# Patient Record
Sex: Male | Born: 1990 | Race: White | Hispanic: No | Marital: Single | Smoking: Current every day smoker
Health system: Southern US, Community
[De-identification: ages and names within clinical notes are randomized; demographics above are authoritative.]

---
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
## 2012-08-20 ENCOUNTER — Encounter (HOSPITAL_BASED_OUTPATIENT_CLINIC_OR_DEPARTMENT_OTHER): Payer: Self-pay | Admitting: Emergency Medicine

## 2012-08-20 ENCOUNTER — Emergency Department (HOSPITAL_BASED_OUTPATIENT_CLINIC_OR_DEPARTMENT_OTHER)
Admission: EM | Admit: 2012-08-20 | Discharge: 2012-08-21 | Disposition: A | Discharge status: Home / Self Care | Payer: Medicaid Other | Attending: Emergency Medicine | Admitting: Emergency Medicine

## 2012-08-20 DIAGNOSIS — W268XXA Contact with other sharp object(s), not elsewhere classified, initial encounter: Secondary | ICD-10-CM | POA: Insufficient documentation

## 2012-08-20 DIAGNOSIS — Y9289 Other specified places as the place of occurrence of the external cause: Secondary | ICD-10-CM | POA: Insufficient documentation

## 2012-08-20 DIAGNOSIS — S61411A Laceration without foreign body of right hand, initial encounter: Secondary | ICD-10-CM

## 2012-08-20 DIAGNOSIS — S61409A Unspecified open wound of unspecified hand, initial encounter: Secondary | ICD-10-CM | POA: Insufficient documentation

## 2012-08-20 DIAGNOSIS — F17200 Nicotine dependence, unspecified, uncomplicated: Secondary | ICD-10-CM | POA: Insufficient documentation

## 2012-08-20 DIAGNOSIS — Y9389 Activity, other specified: Secondary | ICD-10-CM | POA: Insufficient documentation

## 2012-08-20 DIAGNOSIS — Z880 Allergy status to penicillin: Secondary | ICD-10-CM | POA: Insufficient documentation

## 2012-08-20 NOTE — ED Notes (Signed)
MD at bedside for laceration repair.

## 2012-08-20 NOTE — ED Notes (Signed)
MD at bedside. 

## 2012-08-20 NOTE — ED Notes (Signed)
Pt sliced his right hand on sheet metal earlier today, got the bleeding stopped after about an hour. Approx 3 inch lac to hand. Bleeding is controlled.

## 2012-08-21 NOTE — ED Provider Notes (Signed)
History     CSN: [id number]  Arrival date & time 08/20/12  2110   First MD Initiated Contact with Patient 08/20/12 2211      Chief Complaint  Patient presents with  . Extremity Laceration    (Consider location/radiation/quality/duration/timing/severity/associated sxs/prior treatment) The history is provided by the patient.   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 Patient states tetanus is up to date. He reports the wound at the scene. At home was concerned that maybe it needed sutures according to a neighbor so he came in for evaluation. There was bleeding initially but it is well controlled now patient has good range of motion of his fingers to the hand no numbness. Pain is minimal maybe it 2-4/10 worse with palpation of the wound. Pain is nonradiating pains described as sharp.  No past medical history on file.  No past surgical history on file.  No family history on file.  History  Substance Use Topics  . Smoking status: Current Every Day Smoker -- 1.00 packs/day  . Smokeless tobacco: Not on file  . Alcohol Use: Yes     Comment: social      Review of Systems  Constitutional: Negative for fever.  HENT: Negative for congestion.   Respiratory: Negative for shortness of breath.   Cardiovascular: Negative for chest pain.  Gastrointestinal: Negative for abdominal pain.  Musculoskeletal: Negative for joint swelling.  Skin: Positive for wound.  Allergic/Immunologic: Negative for immunocompromised state.  Neurological: Negative for numbness.  Hematological: Does not bruise/bleed easily.  Psychiatric/Behavioral: Negative for confusion.    Allergies  Penicillins and Sulfa antibiotics  Home Medications  No current outpatient prescriptions on file.  BP 128/79  Pulse 65  Temp(Src) 98.5 F (36.9 C) (Oral)  Resp 16  Ht 5\' 8"  (1.727 m)   Wt 145 lb (65.772 kg)  BMI 22.05 kg/m2  SpO2 97%  Physical Exam  Nursing note and vitals reviewed. Constitutional: He is oriented to person, place, and time. He appears well-developed and well-nourished. No distress.  HENT:  Head: Normocephalic and atraumatic.  Mouth/Throat: Oropharynx is clear and moist.  Neck: Normal range of motion.  Cardiovascular: Normal rate, regular rhythm and normal heart sounds.   Pulmonary/Chest: Effort normal and breath sounds normal.  Abdominal: Soft. Bowel sounds are normal. There is no tenderness.  Musculoskeletal: Normal range of motion. He exhibits tenderness.  Normal except for right hand. Patient is right-hand dominant the ulnar side of the palm with a 5 cm linear laceration through the dermis but not involving deeper structures. Goes up to the MP crease on the palm. No evidence of foreign body no tendon evidence of tendon injury. Cap refill along the little finger and ring finger are normal good range of motion of both. Radial pulses 2+.    Neurological: He is alert and oriented to person, place, and time. No cranial nerve deficit. He exhibits normal muscle tone. Coordination normal.  Skin: Skin is warm.    ED Course  Procedures (including critical care time)  Labs Reviewed - No data to display No results found.  LACERATION REPAIR Performed by: Shelda Jakes. Authorized by: Shelda Jakes. Consent: Verbal consent obtained. Risks and benefits: risks, benefits and alternatives were discussed Consent given by: patient Patient identity confirmed: provided demographic data Prepped and Draped in normal sterile fashion Wound explored  Laceration Location: Right hand palm ulnar side.  Laceration Length: 5cm  No Foreign Bodies seen or palpated  Anesthesia: local infiltration  Local anesthetic: lidocaine 2% without epinephrine  Anesthetic total: 1 ml  Irrigation method: syringe Amount of cleaning: standard  Skin closure: Simple  interrupted. 4-0 Prolene   Number of sutures: 3  Technique: Simple interrupted.   Patient tolerance: Patient tolerated the procedure well with no immediate complications. No evidence of foreign body or deep structure injury.    1. Hand laceration, right, initial encounter       MDM  Laceration to the radial side of the palm of the right hand. Injury occurred the in the afternoon while working with sheet metal approximate time injury was 4:00 in the afternoon. 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 He cleaned the wound well with water when he got home Sunday but needed to be sutured so he came into for evaluation.  The wound was not significantly deep but it was through the dermis. No tendon involvement. Bleeding well controlled. No signs of infection. The wound was reapproximated loosely with 4-0 Prolene only 3 sutures placed. To allow some drainage and prevent infection since the wound is being closed several hours after the injury. Patient given instructions on wound care.        Shelda Jakes, MD 08/21/12 [phone]

## 2019-01-24 ENCOUNTER — Other Ambulatory Visit: Payer: Self-pay

## 2019-01-24 ENCOUNTER — Emergency Department (HOSPITAL_BASED_OUTPATIENT_CLINIC_OR_DEPARTMENT_OTHER)
Admission: EM | Admit: 2019-01-24 | Discharge: 2019-01-24 | Disposition: A | Discharge status: Home / Self Care | Payer: Self-pay | Attending: Emergency Medicine | Admitting: Emergency Medicine

## 2019-01-24 ENCOUNTER — Encounter (HOSPITAL_BASED_OUTPATIENT_CLINIC_OR_DEPARTMENT_OTHER): Payer: Self-pay

## 2019-01-24 DIAGNOSIS — J020 Streptococcal pharyngitis: Secondary | ICD-10-CM

## 2019-01-24 DIAGNOSIS — F17210 Nicotine dependence, cigarettes, uncomplicated: Secondary | ICD-10-CM | POA: Insufficient documentation

## 2019-01-24 DIAGNOSIS — Z880 Allergy status to penicillin: Secondary | ICD-10-CM | POA: Insufficient documentation

## 2019-01-24 DIAGNOSIS — Z882 Allergy status to sulfonamides status: Secondary | ICD-10-CM | POA: Insufficient documentation

## 2019-01-24 LAB — GROUP A STREP BY PCR: Group A Strep by PCR: DETECTED — AB

## 2019-01-24 MED ORDER — ACETAMINOPHEN 325 MG PO TABS
650.0000 mg | ORAL_TABLET | Freq: Once | ORAL | Status: AC | PRN
  Administered 2019-01-24: 17:00:00 650 mg via ORAL
  Filled 2019-01-24: qty 2

## 2019-01-24 MED ORDER — DEXAMETHASONE 6 MG PO TABS
10.0000 mg | ORAL_TABLET | Freq: Once | ORAL | Status: AC
  Administered 2019-01-24: 10 mg via ORAL
  Filled 2019-01-24: qty 1

## 2019-01-24 MED ORDER — CLINDAMYCIN HCL 300 MG PO CAPS: 300.0000 mg | ORAL_CAPSULE | Freq: Three times a day (TID) | ORAL | 0 refills | Status: AC

## 2019-01-24 NOTE — ED Provider Notes (Signed)
[facility] EM[facility]ENC[facility] DEPARTMENT Provider Note   CSN: [id number] Arrival date & time: 01/24/19  1643     History   Chief Complaint Chief Complaint  Patient presents with  . Sore Throat    HPI Raymond Santos is a 27 y.o. male.     The history is provided by the patient.  Sore Throat This is a new problem. The current episode started yesterday. The problem occurs constantly. The problem has not changed since onset.Pertinent negatives include no chest pain, no abdominal pain, no headaches and no shortness of breath. The symptoms are aggravated by swallowing. Nothing relieves the symptoms. He has tried acetaminophen for the symptoms. The treatment provided moderate relief.    History reviewed. No pertinent past medical history.  There are no active problems to display for this patient.   History reviewed. No pertinent surgical history.      Home Medications    Prior to Admission medications   Medication Sig Start Date End Date Taking? Authorizing Provider  clindamycin (CLEOCIN) 300 MG capsule Take 1 capsule (300 mg total) by mouth 3 (three) times daily for 10 days. 01/24/19 02/03/19  Lennice Sites, DO    Family History No family history on file.  Social History Social History   Tobacco Use  . Smoking status: Current Every Day Smoker    Packs/day: 1.00  . Smokeless tobacco: Never Used  Substance Use Topics  . Alcohol use: Yes    Comment: social  . Drug use: Yes    Types: Marijuana     Allergies   Penicillins and Sulfa antibiotics   Review of Systems Review of Systems  Constitutional: Negative for chills and fever.  HENT: Positive for sore throat. Negative for congestion, dental problem, ear pain, hearing loss, mouth sores, sinus pain, tinnitus and voice change.   Eyes: Negative for pain and visual disturbance.  Respiratory: Negative for cough and shortness of breath.   Cardiovascular: Negative for chest pain and palpitations.   Gastrointestinal: Negative for abdominal pain and vomiting.  Genitourinary: Negative for dysuria and hematuria.  Musculoskeletal: Negative for arthralgias and back pain.  Skin: Negative for color change and rash.  Neurological: Negative for seizures, syncope and headaches.  All other systems reviewed and are negative.    Physical Exam Updated Vital Signs BP 129/89 (BP Location: Left Arm)   Pulse 86   Temp (!) 100.9 F (38.3 C) (Oral)   Resp 20   Ht 5\' 6"  (1.676 m)   Wt 59 kg   SpO2 98%   BMI 20.98 kg/m   Physical Exam Vitals signs and nursing note reviewed.  Constitutional:      Appearance: He is well-developed.  HENT:     Head: Normocephalic and atraumatic.     Right Ear: Tympanic membrane normal.     Left Ear: Tympanic membrane normal.     Mouth/Throat:     Mouth: Mucous membranes are moist.     Pharynx: Uvula midline. Oropharyngeal exudate and posterior oropharyngeal erythema present. No pharyngeal swelling or uvula swelling.     Tonsils: Tonsillar exudate present. No tonsillar abscesses. 2+ on the right. 2+ on the left.  Eyes:     Conjunctiva/sclera: Conjunctivae normal.  Neck:     Musculoskeletal: Normal range of motion and neck supple.  Cardiovascular:     Rate and Rhythm: Normal rate and regular rhythm.     Heart sounds: Normal heart sounds. No murmur.  Pulmonary:     Effort: Pulmonary effort is normal.  No respiratory distress.     Breath sounds: Normal breath sounds.  Abdominal:     Palpations: Abdomen is soft.     Tenderness: There is no abdominal tenderness.  Skin:    General: Skin is warm and dry.     Capillary Refill: Capillary refill takes less than 2 seconds.  Neurological:     General: No focal deficit present.     Mental Status: He is alert.      ED Treatments / Results  Labs (all labs ordered are listed, but only abnormal results are displayed) Labs Reviewed  GROUP A STREP BY PCR - Abnormal; Notable for the following components:       Result Value   Group A Strep by PCR DETECTED (*)    All other components within normal limits    EKG None  Radiology No results found.  Procedures Procedures (including critical care time)  Medications Ordered in ED Medications  acetaminophen (TYLENOL) tablet 650 mg (650 mg Oral Given 01/24/19 1702)  dexamethasone (DECADRON) tablet 10 mg (10 mg Oral Given 01/24/19 1751)     Initial Impression / Assessment and Plan / ED Course  I have reviewed the triage vital signs and the nursing notes.  Pertinent labs & imaging results that were available during my care of the patient were reviewed by me and considered in my medical decision making (see chart for details).        Raymond Santos is a 28 year old male who presents to the ED with sore throat.  Patient with fever but otherwise normal vitals.  Has exudates of his tonsils bilaterally with some redness and swelling but no obvious abscess.  No concern for peritonsillar abscess or retropharyngeal abscess.  Normal voice.  No signs of trismus.  No signs of abscess in the throat.  Normal range of motion of the neck.  No difficulty with mouth opening or closing.  Positive for strep pharyngitis.  Allergic to penicillins will treat with clindamycin.  Patient given dose of Decadron.  Recommend Tylenol Motrin as well for fever.  Given return precautions as this could develop into an abscess but at this time does not appear to have any concerning signs to suggest abscess or deeper space infection.  Given return precautions and discharged from ED in good condition.  This chart was dictated using voice recognition software.  Despite best efforts to proofread,  errors can occur which can change the documentation meaning.    Final Clinical Impressions(s) / ED Diagnoses   Final diagnoses:  Strep pharyngitis    ED Discharge Orders         Ordered    clindamycin (CLEOCIN) 300 MG capsule  3 times daily     01/24/19 1756           Virgina Norfolk, DO 01/24/19 1759

## 2019-01-24 NOTE — ED Triage Notes (Signed)
Pt c/o sore throat, fevers, chills x yesterday.

## 2019-01-28 ENCOUNTER — Ambulatory Visit: Payer: Self-pay | Admitting: *Deleted

## 2019-01-28 NOTE — Telephone Encounter (Signed)
   Reason for Disposition . Difficulty breathing (per caller) but not severe  Answer Assessment - Initial Assessment Questions 1. ANTIBIOTIC: "What antibiotic are you receiving?" "How many times per day?"     Taking Clindamycin  2. ONSET: "When was the antibiotic started?"     Started antibiotic on 01/24/2019 3. SEVERITY: "How bad is the sore throat?"    - MILD: doesn't interfere with eating    - MODERATE: interferes with eating some solids    - SEVERE: can't swallow liquids; drooling      Mild  4. FEVER: "Do you have a fever?" If so, ask: "What is your temperature, how was it measured, and when did it start?"     No fever since starting antibiotic  5. SYMPTOMS: "Are there any other symptoms you're concerned about?" If so, ask: "When did it start?"     Swelling and white pus in back of throat, felt short of breath yesterday evening, better today  6. PREGNANCY: "Is there any chance you are pregnant?" "When was your last menstrual period?" N/A  Protocols used: STREP THROAT INFECTION ON ANTIBIOTIC FOLLOW-UP CALL-A-AH  Patient states he was seen at [facility] ED 01/24/2019 and was diagnosed with strep throat and started on Clindamycin.  He has been taking the medication as prescribed three times per day.  He states his fever and sore throat have improved, but swelling and white exudate is getting worse.  He states he only has mild sore throat that is improved with Aleve.  He states he did feel slightly short of breath last night.  He is not feeling short of breath today, but feels the swelling and white exudate in back of throat are worse today.  Advised patient he should proceed back to [facility] ED for evaluation today to make sure there is not abscess.  Patient agreeable.

## 2019-09-19 ENCOUNTER — Other Ambulatory Visit: Payer: Self-pay

## 2019-09-19 ENCOUNTER — Emergency Department (HOSPITAL_BASED_OUTPATIENT_CLINIC_OR_DEPARTMENT_OTHER)
Admission: EM | Admit: 2019-09-19 | Discharge: 2019-09-19 | Disposition: A | Discharge status: Home / Self Care | Payer: Self-pay | Attending: Emergency Medicine | Admitting: Emergency Medicine

## 2019-09-19 ENCOUNTER — Encounter (HOSPITAL_BASED_OUTPATIENT_CLINIC_OR_DEPARTMENT_OTHER): Payer: Self-pay | Admitting: Emergency Medicine

## 2019-09-19 DIAGNOSIS — Z882 Allergy status to sulfonamides status: Secondary | ICD-10-CM | POA: Insufficient documentation

## 2019-09-19 DIAGNOSIS — F17210 Nicotine dependence, cigarettes, uncomplicated: Secondary | ICD-10-CM | POA: Insufficient documentation

## 2019-09-19 DIAGNOSIS — Z880 Allergy status to penicillin: Secondary | ICD-10-CM | POA: Insufficient documentation

## 2019-09-19 DIAGNOSIS — L089 Local infection of the skin and subcutaneous tissue, unspecified: Secondary | ICD-10-CM | POA: Insufficient documentation

## 2019-09-19 MED ORDER — CLINDAMYCIN HCL 150 MG PO CAPS: 300.0000 mg | ORAL_CAPSULE | Freq: Three times a day (TID) | ORAL | 0 refills | Status: DC

## 2019-09-19 MED ORDER — CLINDAMYCIN HCL 150 MG PO CAPS: 300.0000 mg | ORAL_CAPSULE | Freq: Three times a day (TID) | ORAL | 0 refills | Status: AC

## 2019-09-19 NOTE — ED Triage Notes (Signed)
C/o staph infections left lower leg, inside of left wrist. States h/o same, started few weeks ago

## 2019-09-19 NOTE — ED Notes (Signed)
ED Provider at bedside. 

## 2019-09-19 NOTE — Discharge Instructions (Signed)
Take antibiotics as prescribed. Take the entire course, even if your symptoms improve.  Use Tylenol and ibuprofen to help with pain. Wash daily with soap and water.  Use warm compress 3 times a day to help with inflammation. Keep covered and clean as needed.  Return to the emergency room if you develop fevers, severe worsening pain, increased posturing for the area, or any new, worsening, or concerning symptoms.

## 2019-09-19 NOTE — ED Provider Notes (Signed)
[facility] EMERGENCY DEPARTMENT Provider Note   CSN: [id number] Arrival date & time: 09/19/19  5974     History Chief Complaint  Patient presents with  . Cellulitis    Raymond Santos is a 29 y.o. male presenting for evaluation of skin lesion.   Pt states over the past several weeks he has had multiple skin lesions. He has 2 lesions on his L wrist and one on his anterior left shin. The lesion on his shin started 3 days ago. It is the most painful. It is draining purulent material, none of the other lesions are. He is using warm rags x1/day, using neosporin, and keeping it clean with a bandaid. He has no other medical problems, takes no medications daily. He denies fevers, chill, n/v, abd pain. He has not been in any hot tubs lakes, or bodies of water recently.   HPI     History reviewed. No pertinent past medical history.  There are no problems to display for this patient.   History reviewed. No pertinent surgical history.     History reviewed. No pertinent family history.  Social History   Tobacco Use  . Smoking status: Current Every Day Smoker    Packs/day: 1.00  . Smokeless tobacco: Never Used  Substance Use Topics  . Alcohol use: Yes    Comment: social  . Drug use: Yes    Types: Marijuana    Home Medications Prior to Admission medications   Medication Sig Start Date End Date Taking? Authorizing Provider  clindamycin (CLEOCIN) 150 MG capsule Take 2 capsules (300 mg total) by mouth 3 (three) times daily for 7 days. 09/19/19 09/26/19  Rasheed Welty, PA-C    Allergies    Penicillins and Sulfa antibiotics  Review of Systems   Review of Systems  Constitutional: Negative for fever.  Skin: Positive for wound.    Physical Exam Updated Vital Signs BP 123/86 (BP Location: Left Arm)   Pulse 91   Temp 97.8 F (36.6 C)   Resp 20   Ht 5\' 4"  (1.626 m)   Wt 54.4 kg   SpO2 100%   BMI 20.60 kg/m   Physical Exam Vitals and nursing note reviewed.    Constitutional:      General: He is not in acute distress.    Appearance: He is well-developed.  HENT:     Head: Normocephalic and atraumatic.  Pulmonary:     Effort: Pulmonary effort is normal.  Abdominal:     General: There is no distension.  Musculoskeletal:        General: Normal range of motion.     Cervical back: Normal range of motion.  Skin:    General: Skin is warm.     Capillary Refill: Capillary refill takes less than 2 seconds.     Findings: No rash.     Comments: Skin lesions of palmar radial rist with inflammation. No fluctuance or induration. No  Head. No drainage.  Skin lesion of dorsal hand over the 5th mtp without head, fluctuance, induration, or drainage.  Skin lesions of anterior L mid shin with purulent drainage and surrounding erythema. No palpable pocket.  No streaking. No lesions elsewhere  Neurological:     Mental Status: He is alert and oriented to person, place, and time.     ED Results / Procedures / Treatments   Labs (all labs ordered are listed, but only abnormal results are displayed) Labs Reviewed - No data to display  EKG None  Radiology  No results found.  Procedures Procedures (including critical care time)  Medications Ordered in ED Medications - No data to display  ED Course  I have reviewed the triage vital signs and the nursing notes.  Pertinent labs & imaging results that were available during my care of the patient were reviewed by me and considered in my medical decision making (see chart for details).    MDM Rules/Calculators/A&P                      Patient presenting for evaluation of skin lesions.  On exam, lesion of the left anterior shin has purulent drainage and surrounding erythema, concern for infection.  The other 2 lesions of the left hand do not appear infected, no obvious drainable abscess.  Will treat with antibiotics.  Discussed wound care.  At this time, patient appears safe for discharge.  Return precautions  given.  Patient states he understands and agrees to plan.  Final Clinical Impression(s) / ED Diagnoses Final diagnoses:  Infected skin lesion    Rx / DC Orders ED Discharge Orders         Ordered    clindamycin (CLEOCIN) 150 MG capsule  3 times daily     09/19/19 0944           Franchot Heidelberg, PA-C 09/19/19 4010    Tegeler, Gwenyth Allegra, MD 09/19/19 1520

## 2019-09-20 MED FILL — CLINDAMYCIN HCL 150 MG CAPS: 150 | 7 days supply | Qty: 42 | Fill #0

## 2020-04-30 ENCOUNTER — Emergency Department (HOSPITAL_BASED_OUTPATIENT_CLINIC_OR_DEPARTMENT_OTHER)
Admission: EM | Admit: 2020-04-30 | Discharge: 2020-04-30 | Disposition: A | Discharge status: Home / Self Care | Payer: Self-pay | Attending: Emergency Medicine | Admitting: Emergency Medicine

## 2020-04-30 ENCOUNTER — Emergency Department (HOSPITAL_BASED_OUTPATIENT_CLINIC_OR_DEPARTMENT_OTHER): Payer: Self-pay

## 2020-04-30 ENCOUNTER — Encounter (HOSPITAL_BASED_OUTPATIENT_CLINIC_OR_DEPARTMENT_OTHER): Payer: Self-pay | Admitting: Emergency Medicine

## 2020-04-30 ENCOUNTER — Other Ambulatory Visit: Payer: Self-pay

## 2020-04-30 DIAGNOSIS — F17200 Nicotine dependence, unspecified, uncomplicated: Secondary | ICD-10-CM | POA: Insufficient documentation

## 2020-04-30 DIAGNOSIS — L02512 Cutaneous abscess of left hand: Secondary | ICD-10-CM | POA: Insufficient documentation

## 2020-04-30 LAB — CBC WITH DIFFERENTIAL/PLATELET
Abs Immature Granulocytes: 0.01 10*3/uL (ref 0.00–0.07)
Basophils Absolute: 0 10*3/uL (ref 0.0–0.1)
Basophils Relative: 1 %
Eosinophils Absolute: 0.1 10*3/uL (ref 0.0–0.5)
Eosinophils Relative: 2 %
HCT: 44.6 % (ref 39.0–52.0)
Hemoglobin: 15.3 g/dL (ref 13.0–17.0)
Immature Granulocytes: 0 %
Lymphocytes Relative: 37 %
Lymphs Abs: 1.9 10*3/uL (ref 0.7–4.0)
MCH: 29.4 pg (ref 26.0–34.0)
MCHC: 34.3 g/dL (ref 30.0–36.0)
MCV: 85.6 fL (ref 80.0–100.0)
Monocytes Absolute: 0.5 10*3/uL (ref 0.1–1.0)
Monocytes Relative: 10 %
Neutro Abs: 2.6 10*3/uL (ref 1.7–7.7)
Neutrophils Relative %: 50 %
Platelets: 373 10*3/uL (ref 150–400)
RBC: 5.21 MIL/uL (ref 4.22–5.81)
RDW: 12.3 % (ref 11.5–15.5)
WBC: 5.2 10*3/uL (ref 4.0–10.5)
nRBC: 0 % (ref 0.0–0.2)

## 2020-04-30 LAB — BASIC METABOLIC PANEL
Anion gap: 9 (ref 5–15)
BUN: 9 mg/dL (ref 6–20)
CO2: 29 mmol/L (ref 22–32)
Calcium: 9.2 mg/dL (ref 8.9–10.3)
Chloride: 102 mmol/L (ref 98–111)
Creatinine, Ser: 0.76 mg/dL (ref 0.61–1.24)
GFR, Estimated: >60 mL/min (ref >60)
Glucose, Bld: 123 mg/dL — ABNORMAL HIGH (ref 70–99)
Potassium: 3.5 mmol/L (ref 3.5–5.1)
Sodium: 140 mmol/L (ref 135–145)

## 2020-04-30 MED ORDER — BUPIVACAINE HCL 0.25 % IJ SOLN
5.0000 mL | Freq: Once | INTRAMUSCULAR | Status: DC
  Filled 2020-04-30: qty 5

## 2020-04-30 MED ORDER — BUPIVACAINE HCL (PF) 0.25 % IJ SOLN
INTRAMUSCULAR | Status: AC
  Filled 2020-04-30: qty 30

## 2020-04-30 MED ORDER — DOXYCYCLINE HYCLATE 100 MG PO CAPS: 100.0000 mg | ORAL_CAPSULE | Freq: Two times a day (BID) | ORAL | 0 refills | Status: DC

## 2020-04-30 NOTE — ED Provider Notes (Signed)
[facility] EMERGENCY DEPARTMENT Provider Note   CSN: [id number] Arrival date & time: 04/30/20  0117     History Chief Complaint  Patient presents with  . finger infection    Raymond Santos is a 30 y.o. male.  HPI Patient presents with wound swelling and infection to left ring finger.  It has been draining pus.  States it was a scab on it that it peeled off.  Now more actively draining.  States finger is more swollen.  Has been there for the last few days.  States he can really not bend it.  No fevers.  Has had abscesses in other parts of his body.  Denies IV drug use.  Does smoke cigarettes.    History reviewed. No pertinent past medical history.  There are no problems to display for this patient.   History reviewed. No pertinent surgical history.     No family history on file.  Social History   Tobacco Use  . Smoking status: Current Every Day Smoker    Packs/day: 1.00  . Smokeless tobacco: Never Used  Vaping Use  . Vaping Use: Never used  Substance Use Topics  . Alcohol use: Yes    Comment: social  . Drug use: Yes    Types: Marijuana    Home Medications Prior to Admission medications   Medication Sig Start Date End Date Taking? Authorizing Provider  doxycycline (VIBRAMYCIN) 100 MG capsule Take 1 capsule (100 mg total) by mouth 2 (two) times daily. 04/30/20  Yes Benjiman Core, MD    Allergies    Penicillins and Sulfa antibiotics  Review of Systems   Review of Systems  Constitutional: Negative for appetite change and fever.  HENT: Negative for congestion.   Respiratory: Negative for shortness of breath.   Cardiovascular: Negative for chest pain.  Gastrointestinal: Negative for abdominal pain.  Genitourinary: Negative for flank pain.  Musculoskeletal:       Left ring finger infection.  Skin: Positive for wound.  Neurological: Negative for weakness.  Psychiatric/Behavioral: Negative for confusion.    Physical Exam Updated Vital  Signs BP 120/76   Pulse 84   Temp 98 F (36.7 C)   Resp 17   Ht 5\' 6"  (1.676 m)   Wt 54.4 kg   SpO2 99%   BMI 19.36 kg/m   Physical Exam Vitals and nursing note reviewed.  Constitutional:      Appearance: Normal appearance.  HENT:     Head: Atraumatic.  Cardiovascular:     Rate and Rhythm: Regular rhythm.  Pulmonary:     Breath sounds: No wheezing or rhonchi.  Abdominal:     Tenderness: There is no abdominal tenderness.  Musculoskeletal:     Cervical back: Neck supple.     Comments: Patient with erythema swelling over dorsal aspect of left ring finger.  Over the medial phalanx of the finger there is purulent drainage.  Patient is unable to flex the finger.  Patient has black painted fingernails.  Skin:    General: Skin is warm.     Capillary Refill: Capillary refill takes less than 2 seconds.  Neurological:     Mental Status: He is alert and oriented to person, place, and time.  Psychiatric:     Comments: Patient is somewhat anxious.         ED Results / Procedures / Treatments   Labs (all labs ordered are listed, but only abnormal results are displayed) Labs Reviewed  BASIC METABOLIC PANEL - Abnormal; Notable  for the following components:      Result Value   Glucose, Bld 123 (*)    All other components within normal limits  RESP PANEL BY RT-PCR (FLU A&B, COVID) ARPGX2  CBC WITH DIFFERENTIAL/PLATELET    EKG None  Radiology DG Finger Ring Left  Result Date: 04/30/2020 CLINICAL DATA:  Left fourth digit swelling, infection now with erythema and purulent drainage EXAM: LEFT RING FINGER 2+V COMPARISON:  None. FINDINGS: Diffuse soft tissue swelling of the fourth digit with more focal soft tissue irregularity and possible pustule along the volar aspect at the level of the middle phalanx. No soft tissue gas or foreign body. No subjacent osseous injury or radiographic features of osteomyelitis. IMPRESSION: Soft tissue swelling of the fourth digit with more focal  thickening and possible pustule along the volar aspect at the level of the middle phalanx, correlate with physical exam. No radiographic evidence of osteomyelitis or other acute osseous abnormality. Electronically Signed   By: Kreg Shropshire M.D.   On: 04/30/2020 04:42    Procedures Procedures (including critical care time)  Medications Ordered in ED Medications  bupivacaine (MARCAINE) 0.25 % (with pres) injection 5 mL (has no administration in time range)  bupivacaine (PF) (MARCAINE) 0.25 % injection (has no administration in time range)    ED Course  I have reviewed the triage vital signs and the nursing notes.  Pertinent labs & imaging results that were available during my care of the patient were reviewed by me and considered in my medical decision making (see chart for details).    MDM Rules/Calculators/A&P                          Patient presents with abscess on dorsum of left ring finger..  Purulent drainage.  History of previous abscesses.  Difficulty bending finger although is able to bend at the PIP joint but really not DIP joint.  Patient refused Marcaine to help with pain control.  States that he does not like needles.  Discussed with Dr. Ave Filter on-call for hand surgery.  States that patient can follow-up in the office at 9:00.  Although it sounds like patient is not going to do this.  States he thinks he could just be treated with antibiotics.  Informed patient of the need for short-term follow-up.  Patient discharged.  Patient also refused COVID test which been ordered in case patient needed operation. Final Clinical Impression(s) / ED Diagnoses Final diagnoses:  Abscess of finger of left hand    Rx / DC Orders ED Discharge Orders         Ordered    doxycycline (VIBRAMYCIN) 100 MG capsule  2 times daily        04/30/20 0519           Benjiman Core, MD 04/30/20 [phone]

## 2020-04-30 NOTE — ED Triage Notes (Signed)
Reports infection in left ring finger for the last few days.  Reports it opened up today.  Finger noted to be swollen and red with purulent drainage.

## 2020-04-30 NOTE — Discharge Instructions (Addendum)
Follow-up with Dr. Veda Canning office at 9 AM this morning.

## 2020-04-30 NOTE — ED Notes (Signed)
Pt refusing swab, swearing @ staff. Attempted to explain to pt the necessity of obtaining swab for possible future hand surgery. Pt continued to swear at staff. Notified MD of pt refusal.

## 2021-06-07 IMAGING — DX DG FINGER RING 2+V*L*
3 series · 3 of 3 positions shown · non-contrast
Comparison: None.

CLINICAL DATA: Left fourth digit swelling, infection now with
erythema and purulent drainage

EXAM:
LEFT RING FINGER 2+V

[finger ap]
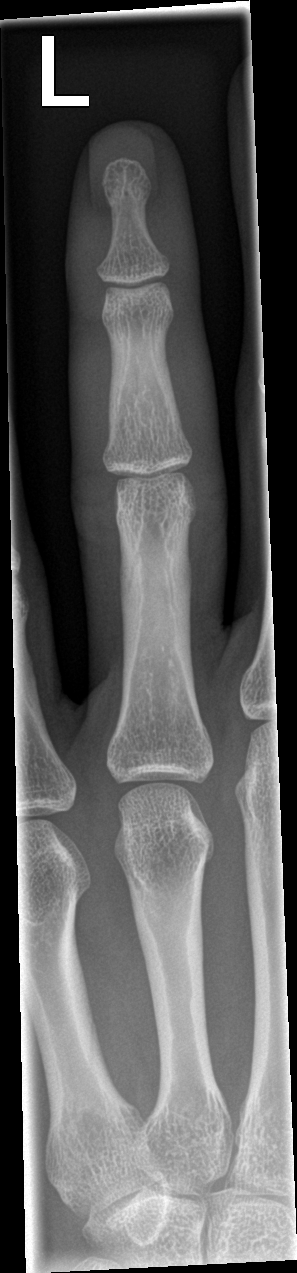

[finger obl]
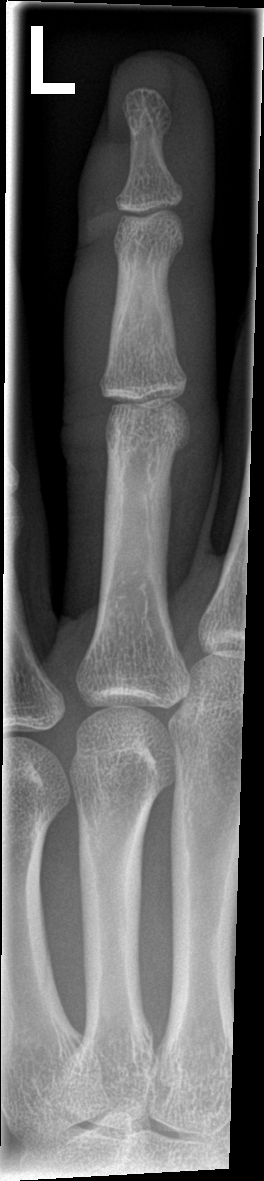

[finger lat]
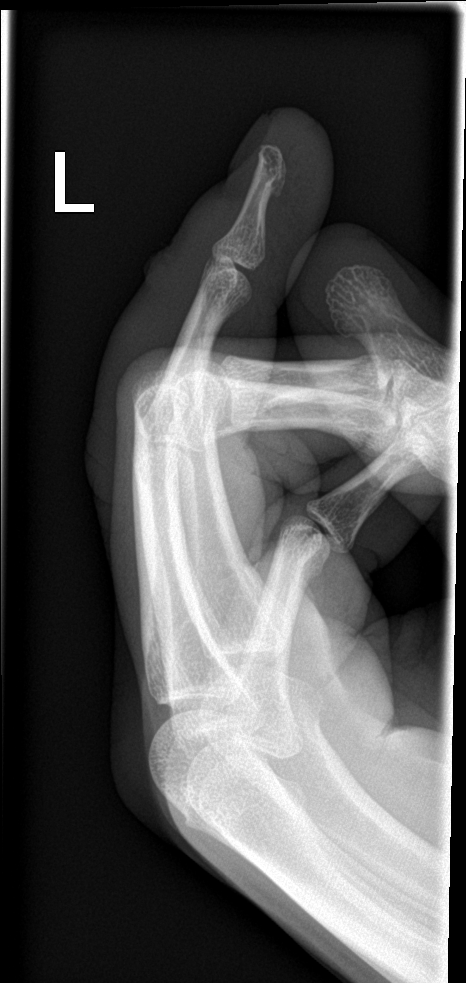

[3 of 3 positions shown; findings below may reference images not displayed]

FINDINGS: Diffuse soft tissue swelling of the fourth digit with more focal
soft tissue irregularity and possible pustule along the volar aspect
at the level of the middle phalanx. No soft tissue gas or foreign
body. No subjacent osseous injury or radiographic features of
osteomyelitis.
IMPRESSION: Soft tissue swelling of the fourth digit with more focal thickening
and possible pustule along the volar aspect at the level of the
middle phalanx, correlate with physical exam. No radiographic
evidence of osteomyelitis or other acute osseous abnormality.

## 2023-07-16 ENCOUNTER — Emergency Department (HOSPITAL_BASED_OUTPATIENT_CLINIC_OR_DEPARTMENT_OTHER)
Admission: EM | Admit: 2023-07-16 | Discharge: 2023-07-16 | Disposition: A | Discharge status: Home / Self Care | Payer: Self-pay | Attending: Emergency Medicine | Admitting: Emergency Medicine

## 2023-07-16 ENCOUNTER — Encounter (HOSPITAL_BASED_OUTPATIENT_CLINIC_OR_DEPARTMENT_OTHER): Payer: Self-pay

## 2023-07-16 ENCOUNTER — Other Ambulatory Visit: Payer: Self-pay

## 2023-07-16 DIAGNOSIS — M7918 Myalgia, other site: Secondary | ICD-10-CM | POA: Insufficient documentation

## 2023-07-16 DIAGNOSIS — Z202 Contact with and (suspected) exposure to infections with a predominantly sexual mode of transmission: Secondary | ICD-10-CM | POA: Insufficient documentation

## 2023-07-16 DIAGNOSIS — B009 Herpesviral infection, unspecified: Secondary | ICD-10-CM | POA: Insufficient documentation

## 2023-07-16 LAB — URINALYSIS, ROUTINE W REFLEX MICROSCOPIC
Bilirubin Urine: NEGATIVE
Glucose, UA: NEGATIVE mg/dL
Hgb urine dipstick: NEGATIVE
Ketones, ur: NEGATIVE mg/dL
Nitrite: NEGATIVE
Protein, ur: NEGATIVE mg/dL
Specific Gravity, Urine: 1.02 (ref 1.005–1.030)
pH: 7.5 (ref 5.0–8.0)

## 2023-07-16 LAB — RESP PANEL BY RT-PCR (RSV, FLU A&B, COVID)  RVPGX2
Influenza A by PCR: NEGATIVE
Influenza B by PCR: NEGATIVE
Resp Syncytial Virus by PCR: NEGATIVE
SARS Coronavirus 2 by RT PCR: NEGATIVE

## 2023-07-16 LAB — URINALYSIS, MICROSCOPIC (REFLEX)

## 2023-07-16 MED ORDER — VALACYCLOVIR HCL 1 G PO TABS: 1000.0000 mg | ORAL_TABLET | Freq: Two times a day (BID) | ORAL | 0 refills | Status: AC

## 2023-07-16 MED ORDER — DOXYCYCLINE HYCLATE 100 MG PO CAPS: 100.0000 mg | ORAL_CAPSULE | Freq: Two times a day (BID) | ORAL | 0 refills | Status: AC

## 2023-07-16 MED ORDER — DOXYCYCLINE HYCLATE 100 MG PO TABS
100.0000 mg | ORAL_TABLET | Freq: Once | ORAL | Status: AC
  Administered 2023-07-16: 100 mg via ORAL
  Filled 2023-07-16: qty 1

## 2023-07-16 MED ORDER — DOXYCYCLINE HYCLATE 100 MG PO CAPS: 100.0000 mg | ORAL_CAPSULE | Freq: Two times a day (BID) | ORAL | 0 refills | Status: DC

## 2023-07-16 MED ORDER — LIDOCAINE HCL (PF) 1 % IJ SOLN
INTRAMUSCULAR | Status: AC
  Administered 2023-07-16: 5 mL
  Filled 2023-07-16: qty 5

## 2023-07-16 MED ORDER — VALACYCLOVIR HCL 1 G PO TABS: 1000.0000 mg | ORAL_TABLET | Freq: Two times a day (BID) | ORAL | 0 refills | Status: DC

## 2023-07-16 MED ORDER — CEFTRIAXONE SODIUM 500 MG IJ SOLR
500.0000 mg | Freq: Once | INTRAMUSCULAR | Status: AC
  Administered 2023-07-16: 500 mg via INTRAMUSCULAR
  Filled 2023-07-16: qty 500

## 2023-07-16 NOTE — ED Notes (Addendum)
 Pt refused to collect GC/ Chlamydia and wet prep swabs.  RN made aware.

## 2023-07-16 NOTE — ED Provider Notes (Signed)
 [place] EMERGENCY DEPARTMENT AT [facility] Provider Note   CSN: [id number] Arrival date & time: 07/16/23  2116     History  Chief Complaint  Patient presents with   flu-like symptoms    Raymond Santos is a 33 y.o. male without past medical history reporting to emergency room with complaint of 1 week of flulike symptoms.  He reports muscle aches, chills, genital rash and oral rash.  Patient reports he has high risk sexual activity.  Reports that he has sexual activity with genders and does not wear protection.  Reports that he is concerned about sexually transmitted diseases.  Reports that he has had history of cold sores in the past but never had ulcers in genital area.  Patient denies any penile drainage or dysuria.  Denies any sore throat.  Patient denies any chest pain, shortness of breath, abdominal pain.  HPI     Home Medications Prior to Admission medications   Medication Sig Start Date End Date Taking? Authorizing Provider  doxycycline (VIBRAMYCIN) 100 MG capsule Take 1 capsule (100 mg total) by mouth 2 (two) times daily. 07/16/23  Yes Heaven Wandell, Horald Chestnut, PA-C  valACYclovir (VALTREX) 1000 MG tablet Take 1 tablet (1,000 mg total) by mouth 2 (two) times daily for 14 days. 07/16/23 07/30/23 Yes Nilson Tabora, Horald Chestnut, PA-C      Allergies    Penicillins and Sulfa antibiotics    Review of Systems   Review of Systems  Genitourinary:  Positive for genital sores.    Physical Exam Updated Vital Signs BP 114/79 (BP Location: Left Arm)   Pulse (!) 134   Temp 98.1 F (36.7 C) (Oral)   Resp 20   Ht 5\' 7"  (1.702 m)   Wt 59 kg   SpO2 100%   BMI 20.36 kg/m  Physical Exam Vitals and nursing note reviewed.  Constitutional:      General: He is not in acute distress.    Appearance: He is not toxic-appearing.  HENT:     Head: Normocephalic and atraumatic.  Eyes:     General: No scleral icterus.    Conjunctiva/sclera: Conjunctivae normal.  Cardiovascular:     Rate and  Rhythm: Normal rate and regular rhythm.     Pulses: Normal pulses.     Heart sounds: Normal heart sounds.  Pulmonary:     Effort: Pulmonary effort is normal. No respiratory distress.     Breath sounds: Normal breath sounds.  Abdominal:     General: Abdomen is flat. Bowel sounds are normal.     Palpations: Abdomen is soft.     Tenderness: There is no abdominal tenderness.  Genitourinary:    Comments: Refused GU exam. Musculoskeletal:     Right lower leg: No edema.     Left lower leg: No edema.  Skin:    General: Skin is warm and dry.     Findings: No lesion.     Comments: Yellow crusted lesions to bilateral corner of mouth.  Neurological:     General: No focal deficit present.     Mental Status: He is alert and oriented to person, place, and time. Mental status is at baseline.     ED Results / Procedures / Treatments   Labs (all labs ordered are listed, but only abnormal results are displayed) Labs Reviewed  URINALYSIS, ROUTINE W REFLEX MICROSCOPIC - Abnormal; Notable for the following components:      Result Value   Leukocytes,Ua SMALL (*)    All other components  within normal limits  URINALYSIS, MICROSCOPIC (REFLEX) - Abnormal; Notable for the following components:   Bacteria, UA RARE (*)    All other components within normal limits  RESP PANEL BY RT-PCR (RSV, FLU A&B, COVID)  RVPGX2  RPR  HIV ANTIBODY (ROUTINE TESTING W REFLEX)  GC/CHLAMYDIA PROBE AMP ([place]) NOT AT [facility]    EKG None  Radiology No results found.  Procedures Procedures    Medications Ordered in ED Medications  cefTRIAXone (ROCEPHIN) injection 500 mg (500 mg Intramuscular Given 07/16/23 2228)  doxycycline (VIBRA-TABS) tablet 100 mg (100 mg Oral Given 07/16/23 2226)  lidocaine (PF) (XYLOCAINE) 1 % injection (5 mLs  Given 07/16/23 2228)    ED Course/ Medical Decision Making/ A&P                                 Medical Decision Making Amount and/or Complexity of Data Reviewed Labs:  ordered.  Risk Prescription drug management.   This patient presents to the ED for concern of STD, this involves an extensive number of treatment options, and is a complaint that carries with it a high risk of complications and morbidity.  The differential diagnosis includes gonorrhea, chlamydia, trichomonas, HIV, syphilis, herpes hepatitis, viral upper respiratory infection,   Lab Tests:  I personally interpreted labs.  The pertinent results include:   Syphilis, HIV, gonorrhea and chlamydia pending UA with small amount of bacteria -- no noted specific dysuria  Problem List / ED Course / Critical interventions / Medication management  Patient reporting to emergency room with 2 weeks of muscle aches, cold sore, runny nose, genital rash.  Patient reports he has high risk sexual activity.  He did give confusing triage complaint but he reports he is here because he wants to be tested for STDs.  He is refusing a GU exam.  Offered to have chaperone present or male provider however patient still declined.  He does have bilateral cold sore.  He appears to have moist mucous membrane.  Suspect he has genital herpes given his explanation of rash.  Will test for STDs.  He will monitor everything on MyChart but he would like to be empirically treated for STDs.  He will be given Rocephin and first dose of doxycycline here.  Will send doxycycline and Valtrex to pharmacy.  Notified him do not have sexual activity until he is treated and no longer symptomatic.  He will tell his partners.  Because patient is refusing exam it is unclear what specifically causing symptoms.  He is stable and well-appearing.   Discussed empiric treatment for STD. Patient agreed. Patient denied GU exam.  I ordered medication including Doxy, Rocephin  Reevaluation of the patient after these medicines showed that the patient improved I have reviewed the patients home medicines and have made adjustments as needed   Plan  F/u w/ PCP  in 2-3d to ensure resolution of sx.  Patient was given return precautions. Patient stable for discharge at this time.  Patient educated on sx/dx and verbalized understanding of plan. Return to ER w/ new or worsening sx.          Final Clinical Impression(s) / ED Diagnoses Final diagnoses:  Herpes  Encounter for assessment of STD exposure    Rx / DC Orders ED Discharge Orders          Ordered    valACYclovir (VALTREX) 1000 MG tablet  2 times daily  07/16/23 2204    doxycycline (VIBRAMYCIN) 100 MG capsule  2 times daily        07/16/23 2204              Kincade Granberg, Horald Chestnut, PA-C 07/16/23 2317    Virgina Norfolk, DO 07/18/23 2125

## 2023-07-16 NOTE — ED Notes (Signed)
 Pt wishes not to do strep test

## 2023-07-16 NOTE — ED Triage Notes (Signed)
 Flu-like symptoms x 1 week Reports fever blisters on both sides

## 2023-07-16 NOTE — Discharge Instructions (Addendum)
 Take Valtrex for rash, take twice a day for duration of your rash.  Doxycycline is a medication that helps to treat chlamydia.  Please take this as prescribed. You were given Ceftriaxone which is a medication for gonorrhea, you only need the 1 dose that you are given today. He is monitoring MyChart for results.  Notify sexual partners of concern for sexually transmitted disease.  You should not be sexually active until you and all of your partners have been treated.

## 2023-07-17 LAB — HIV ANTIBODY (ROUTINE TESTING W REFLEX): HIV Screen 4th Generation wRfx: NONREACTIVE

## 2023-07-19 LAB — RPR
RPR Ser Ql: REACTIVE — AB
RPR Titer: 1:32 {titer}

## 2023-07-20 LAB — GC/CHLAMYDIA PROBE AMP (~~LOC~~) NOT AT ARMC
Chlamydia: POSITIVE — AB
Comment: NEGATIVE
Comment: NORMAL
Neisseria Gonorrhea: POSITIVE — AB

## 2023-07-20 LAB — T.PALLIDUM AB, TOTAL: T Pallidum Abs: REACTIVE — AB
